# Patient Record
Sex: Male | Born: 1970 | Hispanic: No | Marital: Married
Health system: Southern US, Community
[De-identification: ages and names within clinical notes are randomized; demographics above are authoritative.]

## PROBLEM LIST (undated history)

## (undated) DIAGNOSIS — I10 Essential (primary) hypertension: Secondary | ICD-10-CM

---

## 2018-06-15 ENCOUNTER — Other Ambulatory Visit: Payer: Self-pay | Admitting: Physician Assistant

## 2018-06-15 DIAGNOSIS — R519 Headache, unspecified: Secondary | ICD-10-CM

## 2018-06-15 DIAGNOSIS — R51 Headache: Principal | ICD-10-CM

## 2018-06-19 ENCOUNTER — Ambulatory Visit (INDEPENDENT_AMBULATORY_CARE_PROVIDER_SITE_OTHER): Payer: No Typology Code available for payment source

## 2018-06-19 ENCOUNTER — Other Ambulatory Visit: Payer: Self-pay | Admitting: Physician Assistant

## 2018-06-19 DIAGNOSIS — R519 Headache, unspecified: Secondary | ICD-10-CM

## 2018-06-19 DIAGNOSIS — R202 Paresthesia of skin: Secondary | ICD-10-CM

## 2018-06-19 DIAGNOSIS — R51 Headache: Secondary | ICD-10-CM | POA: Diagnosis not present

## 2019-11-27 ENCOUNTER — Encounter (HOSPITAL_COMMUNITY): Payer: Self-pay | Admitting: *Deleted

## 2019-11-27 ENCOUNTER — Emergency Department (HOSPITAL_COMMUNITY)
Admission: EM | Admit: 2019-11-27 | Discharge: 2019-11-27 | Disposition: A | Discharge status: Home / Self Care | Payer: No Typology Code available for payment source | Attending: Emergency Medicine | Admitting: Emergency Medicine

## 2019-11-27 DIAGNOSIS — R42 Dizziness and giddiness: Secondary | ICD-10-CM | POA: Insufficient documentation

## 2019-11-27 DIAGNOSIS — Z5321 Procedure and treatment not carried out due to patient leaving prior to being seen by health care provider: Secondary | ICD-10-CM | POA: Diagnosis not present

## 2019-11-27 HISTORY — DX: Essential (primary) hypertension: I10

## 2019-11-27 LAB — BASIC METABOLIC PANEL
Anion gap: 8 (ref 5–15)
BUN: 22 mg/dL — ABNORMAL HIGH (ref 6–20)
CO2: 25 mmol/L (ref 22–32)
Calcium: 9.1 mg/dL (ref 8.9–10.3)
Chloride: 107 mmol/L (ref 98–111)
Creatinine, Ser: 1.07 mg/dL (ref 0.61–1.24)
GFR calc Af Amer: >60 mL/min (ref >60)
GFR calc non Af Amer: >60 mL/min (ref >60)
Glucose, Bld: 106 mg/dL — ABNORMAL HIGH (ref 70–99)
Potassium: 3.9 mmol/L (ref 3.5–5.1)
Sodium: 140 mmol/L (ref 135–145)

## 2019-11-27 LAB — URINALYSIS, ROUTINE W REFLEX MICROSCOPIC
Bilirubin Urine: NEGATIVE
Glucose, UA: NEGATIVE mg/dL
Hgb urine dipstick: NEGATIVE
Ketones, ur: NEGATIVE mg/dL
Leukocytes,Ua: NEGATIVE
Nitrite: NEGATIVE
Protein, ur: NEGATIVE mg/dL
Specific Gravity, Urine: 1.002 — ABNORMAL LOW (ref 1.005–1.030)
pH: 6 (ref 5.0–8.0)

## 2019-11-27 LAB — CBC
HCT: 45.7 % (ref 39.0–52.0)
Hemoglobin: 14.5 g/dL (ref 13.0–17.0)
MCH: 28.5 pg (ref 26.0–34.0)
MCHC: 31.7 g/dL (ref 30.0–36.0)
MCV: 90 fL (ref 80.0–100.0)
Platelets: 217 10*3/uL (ref 150–400)
RBC: 5.08 MIL/uL (ref 4.22–5.81)
RDW: 11.9 % (ref 11.5–15.5)
WBC: 5.3 10*3/uL (ref 4.0–10.5)
nRBC: 0 % (ref 0.0–0.2)

## 2019-11-27 MED ORDER — SODIUM CHLORIDE 0.9% FLUSH: 3.0000 mL | Freq: Once | INTRAVENOUS | Status: DC

## 2019-11-27 NOTE — ED Triage Notes (Signed)
To ED for eval of dizziness when woke this am at 0430. States he layed back down for about - felt same so came to ED. States dizziness is less now. Ambulatory without difficulty. States he felt this same way minutes after his [employer] covid vaccine 10 days ago. Prior to coming to ED pt had juice to drink - no trouble drinking juice. States he hasn't felt 'normal' since the vaccine. No nausea or vomiting - has just felt weak/dizzy. Speech is clear. Skin w/d. No neuro deficits.

## 2019-11-27 NOTE — ED Notes (Signed)
This nurse to lobby to bring pt to room #43, called for pt x 4, no one answered.

## 2020-01-22 IMAGING — CT CT HEAD W/O CM
3 series · 15 of 47 positions shown, 18 images · non-contrast
Comparison: None.

CLINICAL DATA: Pt with headaches and tingling at the top of his
head after playing sports for about 1 year off/on. No other hx. Pt
refused iv contrast so performed w/o only.

EXAM:
CT HEAD WITHOUT CONTRAST
TECHNIQUE: Contiguous axial images were obtained from the base of the skull
through the vertex without intravenous contrast.

[Series 2: head wo · axial · 0.47mm/px · z∈[-150,-25]mm · 9 of 30 slices shown, 12 images]
[im 3/30  brain]
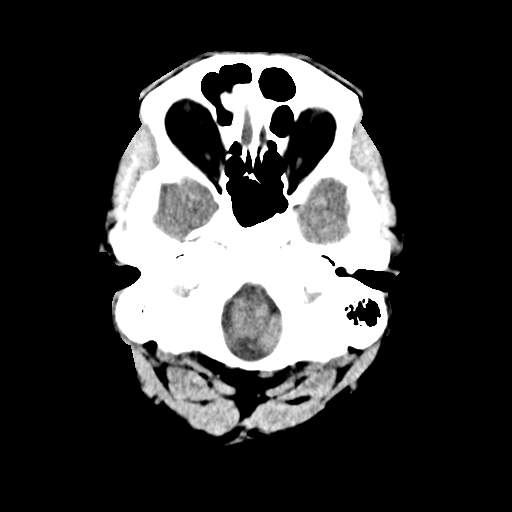
[im 3/30  bone]
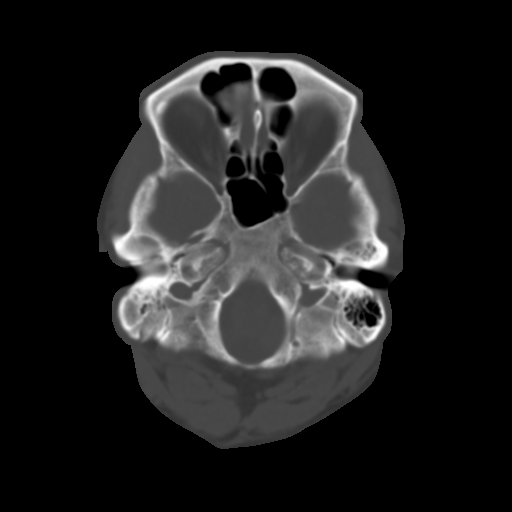
[im 6/30  brain]
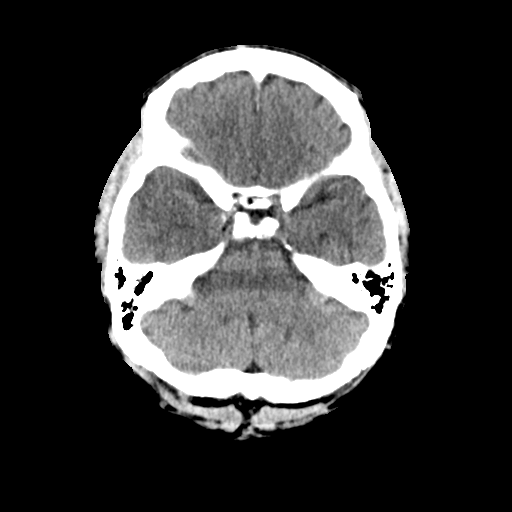
[im 9/30  brain]
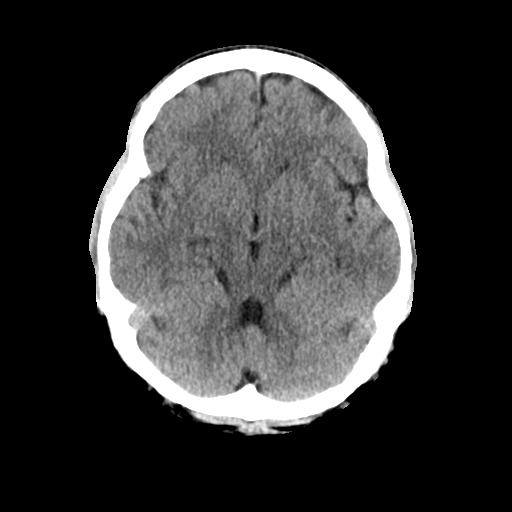
[im 12/30  brain]
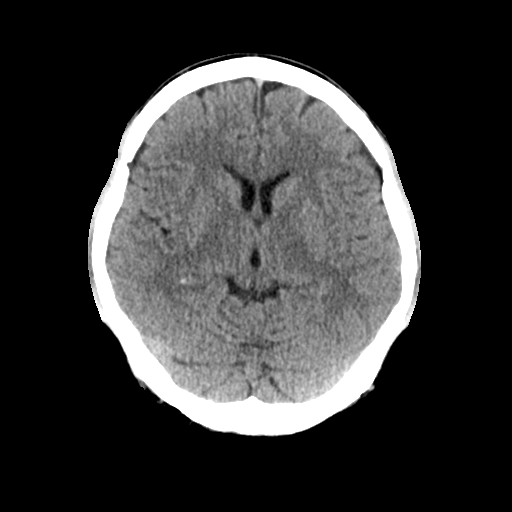
[im 16/30  brain]
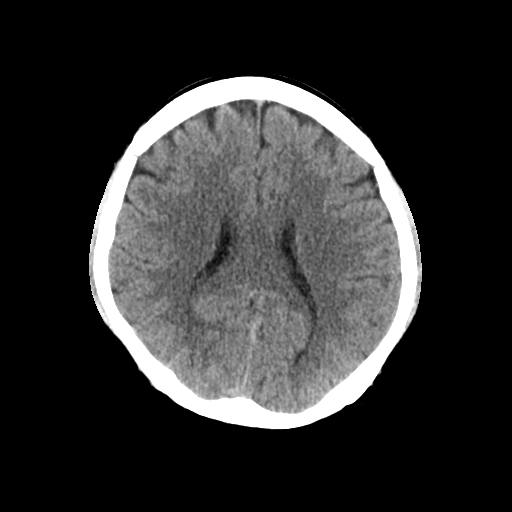
[im 16/30  bone]
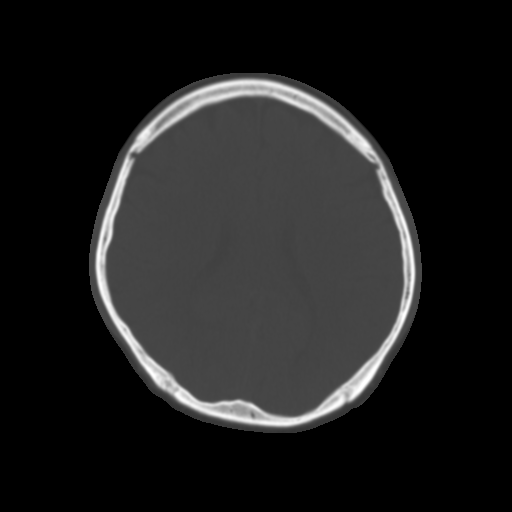
[im 19/30  brain]
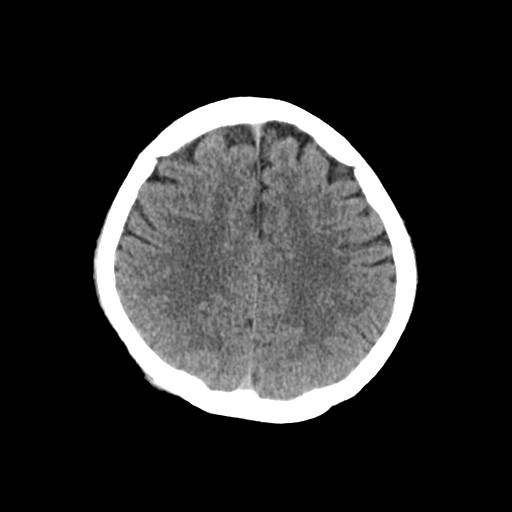
[im 22/30  brain]
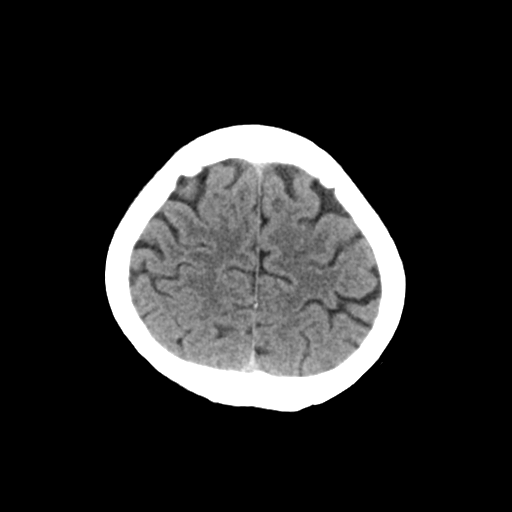
[im 25/30  brain]
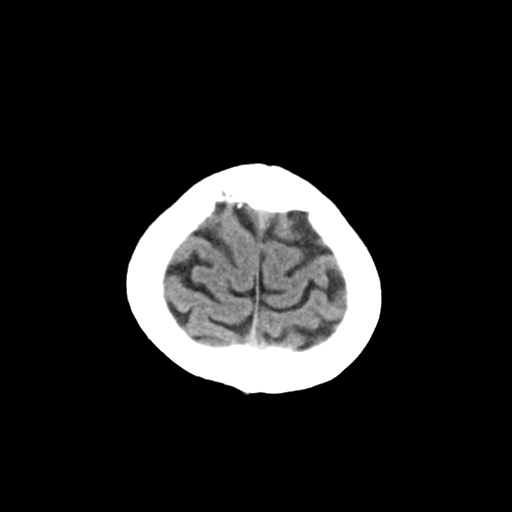
[im 28/30  brain]
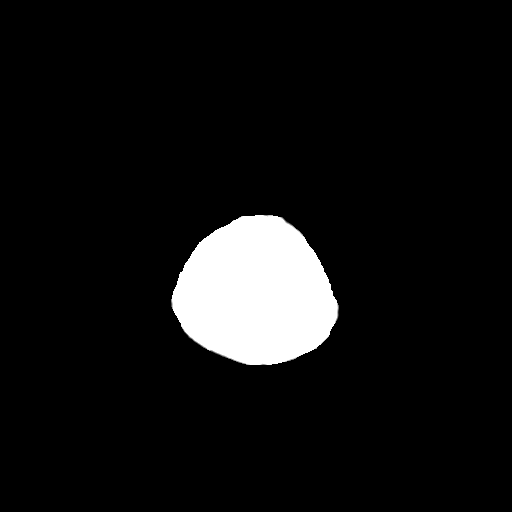
[im 28/30  bone]
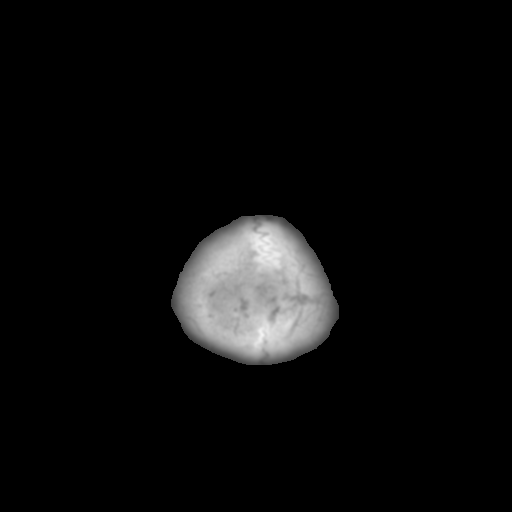

[Series 4: head wo coronal · coronal · 0.30mm/px · 3 of 64 slices shown]
[im 22/64  brain]
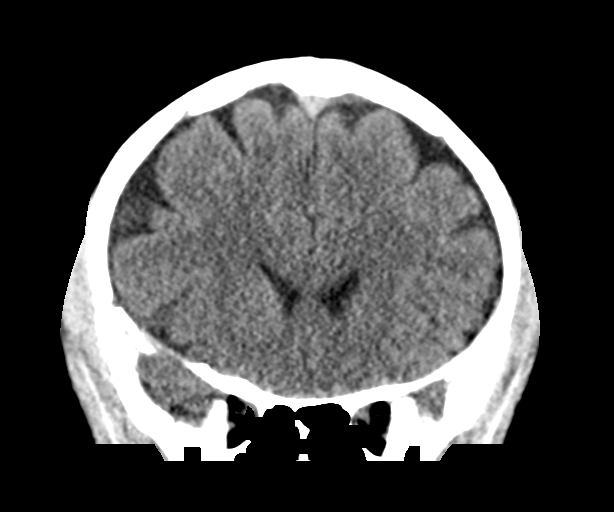
[im 29/64  brain]
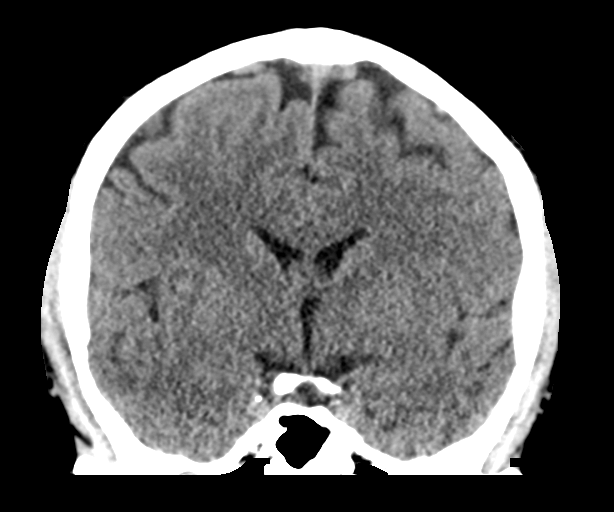
[im 36/64  brain]
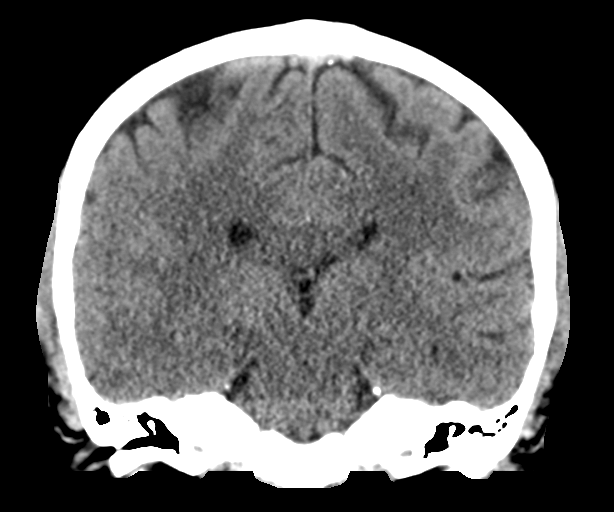

[Series 5: head wo sagittal · sagittal · 0.31mm/px · 3 of 58 slices shown]
[im 20/58  brain]
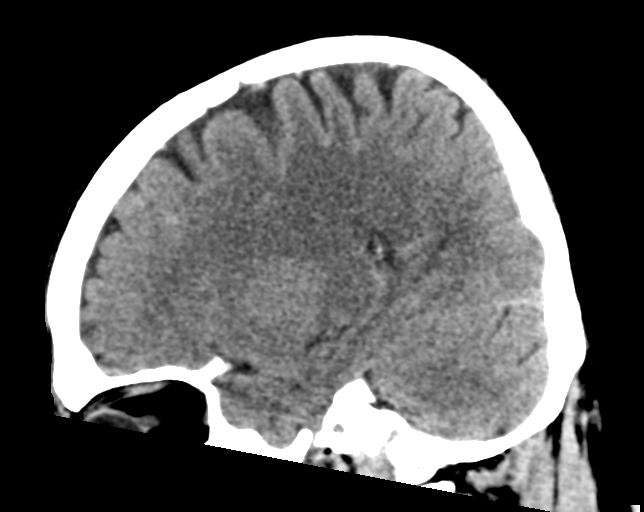
[im 29/58  brain]
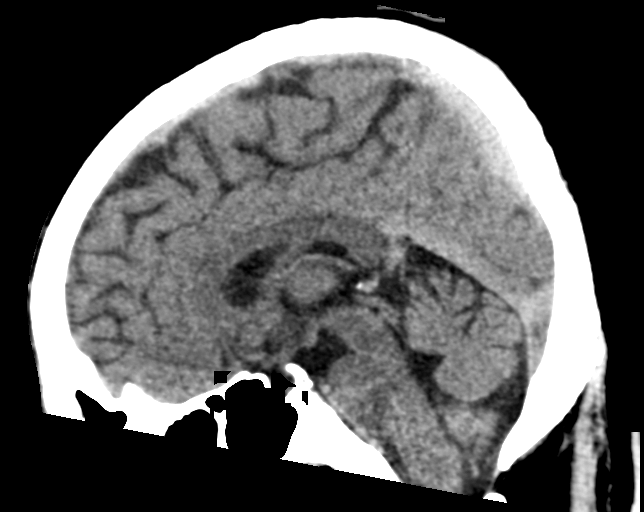
[im 39/58  brain]
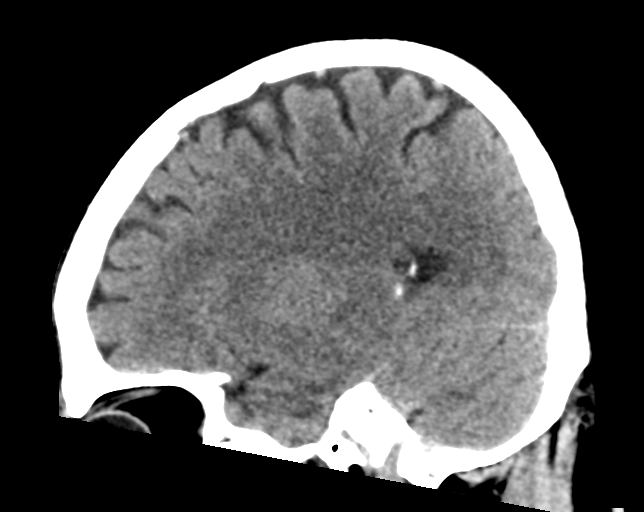

[15 of 47 positions shown; findings below may reference images not displayed]

FINDINGS: Brain: No evidence of acute infarction, hemorrhage, hydrocephalus,
extra-axial collection or mass lesion/mass effect.

Vascular: No hyperdense vessel or unexpected calcification.

Skull: Normal. Negative for fracture or focal lesion.

Sinuses/Orbits: Visualized globes and orbits are unremarkable. The
visualized sinuses and mastoid air cells are clear.

Other: None.
IMPRESSION: Normal unenhanced CT scan of the brain.

## 2022-12-22 ENCOUNTER — Other Ambulatory Visit: Payer: Self-pay | Admitting: Family Medicine

## 2022-12-22 DIAGNOSIS — E7849 Other hyperlipidemia: Secondary | ICD-10-CM

## 2022-12-28 ENCOUNTER — Other Ambulatory Visit: Payer: No Typology Code available for payment source

## 2022-12-29 ENCOUNTER — Ambulatory Visit (INDEPENDENT_AMBULATORY_CARE_PROVIDER_SITE_OTHER): Payer: Self-pay

## 2022-12-29 DIAGNOSIS — E7849 Other hyperlipidemia: Secondary | ICD-10-CM
